# Patient Record
Sex: Female | Born: 1935 | Race: White | Hispanic: No | Smoking: Former smoker
Health system: Southern US, Community
[De-identification: ages and names within clinical notes are randomized; demographics above are authoritative.]

## PROBLEM LIST (undated history)

## (undated) DIAGNOSIS — IMO0002 Reserved for concepts with insufficient information to code with codable children: Secondary | ICD-10-CM

## (undated) DIAGNOSIS — M1990 Unspecified osteoarthritis, unspecified site: Secondary | ICD-10-CM

## (undated) DIAGNOSIS — M810 Age-related osteoporosis without current pathological fracture: Secondary | ICD-10-CM

## (undated) DIAGNOSIS — K649 Unspecified hemorrhoids: Secondary | ICD-10-CM

## (undated) DIAGNOSIS — I10 Essential (primary) hypertension: Secondary | ICD-10-CM

## (undated) DIAGNOSIS — K635 Polyp of colon: Secondary | ICD-10-CM

## (undated) DIAGNOSIS — G510 Bell's palsy: Secondary | ICD-10-CM

## (undated) DIAGNOSIS — D649 Anemia, unspecified: Secondary | ICD-10-CM

## (undated) DIAGNOSIS — H409 Unspecified glaucoma: Secondary | ICD-10-CM

## (undated) DIAGNOSIS — F1011 Alcohol abuse, in remission: Secondary | ICD-10-CM

## (undated) DIAGNOSIS — K861 Other chronic pancreatitis: Secondary | ICD-10-CM

## (undated) DIAGNOSIS — E119 Type 2 diabetes mellitus without complications: Secondary | ICD-10-CM

## (undated) DIAGNOSIS — N189 Chronic kidney disease, unspecified: Secondary | ICD-10-CM

## (undated) DIAGNOSIS — E079 Disorder of thyroid, unspecified: Secondary | ICD-10-CM

## (undated) DIAGNOSIS — E785 Hyperlipidemia, unspecified: Secondary | ICD-10-CM

## (undated) HISTORY — DX: Bell's palsy: G51.0

## (undated) HISTORY — DX: Disorder of thyroid, unspecified: E07.9

## (undated) HISTORY — DX: Anemia, unspecified: D64.9

## (undated) HISTORY — DX: Unspecified hemorrhoids: K64.9

## (undated) HISTORY — DX: Age-related osteoporosis without current pathological fracture: M81.0

## (undated) HISTORY — DX: Reserved for concepts with insufficient information to code with codable children: IMO0002

## (undated) HISTORY — DX: Alcohol abuse, in remission: F10.11

## (undated) HISTORY — DX: Hyperlipidemia, unspecified: E78.5

## (undated) HISTORY — DX: Polyp of colon: K63.5

## (undated) HISTORY — DX: Chronic kidney disease, unspecified: N18.9

## (undated) HISTORY — DX: Type 2 diabetes mellitus without complications: E11.9

## (undated) HISTORY — DX: Other chronic pancreatitis: K86.1

## (undated) HISTORY — DX: Unspecified osteoarthritis, unspecified site: M19.90

## (undated) HISTORY — DX: Unspecified glaucoma: H40.9

## (undated) HISTORY — DX: Essential (primary) hypertension: I10

## (undated) HISTORY — PX: EYE SURGERY: SHX253

---

## 2006-12-24 LAB — HM PAP SMEAR

## 2007-11-29 ENCOUNTER — Emergency Department (HOSPITAL_BASED_OUTPATIENT_CLINIC_OR_DEPARTMENT_OTHER): Admission: EM | Admit: 2007-11-29 | Discharge: 2007-11-29 | Payer: Self-pay | Admitting: Emergency Medicine

## 2007-12-24 ENCOUNTER — Ambulatory Visit: Payer: Self-pay | Admitting: Diagnostic Radiology

## 2007-12-24 ENCOUNTER — Ambulatory Visit (HOSPITAL_BASED_OUTPATIENT_CLINIC_OR_DEPARTMENT_OTHER): Admission: RE | Admit: 2007-12-24 | Discharge: 2007-12-24 | Payer: Self-pay | Admitting: Family Medicine

## 2009-06-06 ENCOUNTER — Emergency Department (HOSPITAL_BASED_OUTPATIENT_CLINIC_OR_DEPARTMENT_OTHER): Admission: EM | Admit: 2009-06-06 | Discharge: 2009-06-07 | Payer: Self-pay | Admitting: Emergency Medicine

## 2009-06-06 ENCOUNTER — Ambulatory Visit: Payer: Self-pay | Admitting: Radiology

## 2010-04-11 LAB — BASIC METABOLIC PANEL
Calcium: 12.3 mg/dL — ABNORMAL HIGH (ref 8.4–10.5)
Creatinine, Ser: 9.8 mg/dL — ABNORMAL HIGH (ref 0.4–1.2)
GFR calc non Af Amer: 4 mL/min — ABNORMAL LOW (ref >60)
Glucose, Bld: 86 mg/dL (ref 70–99)

## 2010-04-11 LAB — URINALYSIS, ROUTINE W REFLEX MICROSCOPIC
Bilirubin Urine: NEGATIVE
Glucose, UA: NEGATIVE mg/dL
Specific Gravity, Urine: 1.011 (ref 1.005–1.030)
pH: 5 (ref 5.0–8.0)

## 2010-04-11 LAB — GLUCOSE, CAPILLARY: Glucose-Capillary: 58 mg/dL — ABNORMAL LOW (ref 70–99)

## 2010-04-11 LAB — DIFFERENTIAL
Basophils Relative: 0 % (ref 0–1)
Eosinophils Absolute: 0.3 10*3/uL (ref 0.0–0.7)
Monocytes Absolute: 0.8 10*3/uL (ref 0.1–1.0)
Monocytes Relative: 8 % (ref 3–12)
Neutro Abs: 8.3 10*3/uL — ABNORMAL HIGH (ref 1.7–7.7)
Neutrophils Relative %: 82 % — ABNORMAL HIGH (ref 43–77)

## 2010-04-11 LAB — URINE MICROSCOPIC-ADD ON

## 2010-04-11 LAB — CBC
Platelets: 163 10*3/uL (ref 150–400)
WBC: 10.2 10*3/uL (ref 4.0–10.5)

## 2010-10-25 LAB — URINALYSIS, ROUTINE W REFLEX MICROSCOPIC
Bilirubin Urine: NEGATIVE
Glucose, UA: NEGATIVE
Ketones, ur: NEGATIVE
Nitrite: NEGATIVE
Protein, ur: 30 — AB
Specific Gravity, Urine: 1.017
Urobilinogen, UA: 0.2
pH: 5

## 2010-10-25 LAB — COMPREHENSIVE METABOLIC PANEL
ALT: 36 — ABNORMAL HIGH
AST: 50 — ABNORMAL HIGH
Albumin: 3.6
Alkaline Phosphatase: 62
BUN: 42 — ABNORMAL HIGH
CO2: 27
Calcium: 9.3
Chloride: 97
Creatinine, Ser: 2 — ABNORMAL HIGH
GFR calc Af Amer: 30 — ABNORMAL LOW
GFR calc non Af Amer: 25 — ABNORMAL LOW
Glucose, Bld: 163 — ABNORMAL HIGH
Potassium: 3.5
Sodium: 136
Total Bilirubin: 0.5
Total Protein: 7

## 2010-10-25 LAB — DIFFERENTIAL
Eosinophils Absolute: 0.1
Eosinophils Relative: 1
Lymphocytes Relative: 5 — ABNORMAL LOW
Lymphs Abs: 0.3 — ABNORMAL LOW
Neutrophils Relative %: 84 — ABNORMAL HIGH

## 2010-10-25 LAB — URINE CULTURE
Colony Count: NO GROWTH
Culture: NO GROWTH

## 2010-10-25 LAB — URINE MICROSCOPIC-ADD ON

## 2010-10-25 LAB — CBC
HCT: 31.3 — ABNORMAL LOW
Hemoglobin: 10.7 — ABNORMAL LOW
Platelets: 134 — ABNORMAL LOW
RDW: 13.2

## 2010-10-25 LAB — GLUCOSE, CAPILLARY: Glucose-Capillary: 154 — ABNORMAL HIGH

## 2011-01-24 LAB — HM COLONOSCOPY

## 2011-11-14 ENCOUNTER — Ambulatory Visit (HOSPITAL_BASED_OUTPATIENT_CLINIC_OR_DEPARTMENT_OTHER)
Admission: RE | Admit: 2011-11-14 | Discharge: 2011-11-14 | Disposition: A | Discharge status: Home / Self Care | Payer: Medicare Other | Source: Ambulatory Visit | Attending: Family Medicine | Admitting: Family Medicine

## 2011-11-14 ENCOUNTER — Other Ambulatory Visit (HOSPITAL_BASED_OUTPATIENT_CLINIC_OR_DEPARTMENT_OTHER): Payer: Self-pay | Admitting: Family Medicine

## 2011-11-14 DIAGNOSIS — R069 Unspecified abnormalities of breathing: Secondary | ICD-10-CM

## 2011-11-14 DIAGNOSIS — R0602 Shortness of breath: Secondary | ICD-10-CM | POA: Insufficient documentation

## 2011-11-14 DIAGNOSIS — R079 Chest pain, unspecified: Secondary | ICD-10-CM | POA: Insufficient documentation

## 2012-09-09 ENCOUNTER — Ambulatory Visit (INDEPENDENT_AMBULATORY_CARE_PROVIDER_SITE_OTHER): Payer: Medicare Other | Admitting: Family Medicine

## 2012-09-09 ENCOUNTER — Ambulatory Visit (HOSPITAL_BASED_OUTPATIENT_CLINIC_OR_DEPARTMENT_OTHER)
Admission: RE | Admit: 2012-09-09 | Discharge: 2012-09-09 | Disposition: A | Discharge status: Home / Self Care | Payer: Medicare Other | Source: Ambulatory Visit | Attending: Family Medicine | Admitting: Family Medicine

## 2012-09-09 ENCOUNTER — Other Ambulatory Visit: Payer: Self-pay | Admitting: *Deleted

## 2012-09-09 ENCOUNTER — Encounter: Payer: Self-pay | Admitting: Family Medicine

## 2012-09-09 VITALS — BP 120/63 | HR 64 | Resp 16 | Ht 68.0 in | Wt 114.0 lb

## 2012-09-09 DIAGNOSIS — E785 Hyperlipidemia, unspecified: Secondary | ICD-10-CM | POA: Insufficient documentation

## 2012-09-09 DIAGNOSIS — D631 Anemia in chronic kidney disease: Secondary | ICD-10-CM | POA: Insufficient documentation

## 2012-09-09 DIAGNOSIS — M159 Polyosteoarthritis, unspecified: Secondary | ICD-10-CM | POA: Insufficient documentation

## 2012-09-09 DIAGNOSIS — N390 Urinary tract infection, site not specified: Secondary | ICD-10-CM | POA: Insufficient documentation

## 2012-09-09 DIAGNOSIS — L2089 Other atopic dermatitis: Secondary | ICD-10-CM | POA: Insufficient documentation

## 2012-09-09 DIAGNOSIS — R0600 Dyspnea, unspecified: Secondary | ICD-10-CM | POA: Insufficient documentation

## 2012-09-09 DIAGNOSIS — R634 Abnormal weight loss: Secondary | ICD-10-CM

## 2012-09-09 DIAGNOSIS — M546 Pain in thoracic spine: Secondary | ICD-10-CM | POA: Insufficient documentation

## 2012-09-09 DIAGNOSIS — R490 Dysphonia: Secondary | ICD-10-CM

## 2012-09-09 DIAGNOSIS — R0602 Shortness of breath: Secondary | ICD-10-CM | POA: Insufficient documentation

## 2012-09-09 DIAGNOSIS — R42 Dizziness and giddiness: Secondary | ICD-10-CM | POA: Insufficient documentation

## 2012-09-09 DIAGNOSIS — M549 Dorsalgia, unspecified: Secondary | ICD-10-CM | POA: Insufficient documentation

## 2012-09-09 DIAGNOSIS — I7090 Unspecified atherosclerosis: Secondary | ICD-10-CM | POA: Insufficient documentation

## 2012-09-09 DIAGNOSIS — E039 Hypothyroidism, unspecified: Secondary | ICD-10-CM

## 2012-09-09 DIAGNOSIS — M810 Age-related osteoporosis without current pathological fracture: Secondary | ICD-10-CM | POA: Insufficient documentation

## 2012-09-09 DIAGNOSIS — F329 Major depressive disorder, single episode, unspecified: Secondary | ICD-10-CM | POA: Insufficient documentation

## 2012-09-09 MED ORDER — LIDOCAINE 5 % EX PTCH: MEDICATED_PATCH | CUTANEOUS | Status: DC

## 2012-09-09 MED ORDER — ACETAMINOPHEN-CODEINE #3 300-30 MG PO TABS: 1.0000 | ORAL_TABLET | Freq: Four times a day (QID) | ORAL | Status: DC | PRN

## 2012-09-09 NOTE — Progress Notes (Signed)
Subjective:    Patient ID: Stacy Ware, female    DOB: Apr 10, 1934, 78 y.o.   MRN: [id number]  Marianela is here today complaining of pain located in her left mid back.    Back Pain This is a recurrent problem. The current episode started more than 1 month ago. The problem occurs constantly. The problem has been waxing and waning since onset. Pain location: left mid back. The quality of the pain is described as stabbing. The pain does not radiate. The pain is moderate. The pain is the same all the time. The symptoms are aggravated by position. (Shortness of breath) Risk factors include menopause and lack of exercise. She has tried analgesics for the symptoms. The treatment provided mild relief.    Review of Systems  Constitutional: Positive for unexpected weight change (Weight Loss ).  HENT: Positive for voice change.   Eyes: Negative.   Respiratory: Negative.   Cardiovascular: Negative.   Gastrointestinal: Negative.   Endocrine: Negative.   Genitourinary: Negative.   Musculoskeletal: Positive for back pain.       Mid back   Skin: Negative.   Allergic/Immunologic: Negative.   Neurological: Negative.   Hematological: Negative.   Psychiatric/Behavioral: Negative.     Past Medical History  Diagnosis Date  . History of alcohol abuse   . 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 She is followed by Dr. Izell McSwain; She sees Dr. Hazle Quant 2-3 x per year.     . Hyperlipidemia   . Hypertension   . Thyroid disease   . Glaucoma   . DDD (degenerative disc disease)   . Osteoporosis   . CKD (chronic kidney disease)      (Stage III);  Creatinine 1.3/Creatinine Clearance 43 on 10/05/2004.  She had a renal U/S in 10/2003 which showed a normal right kidney measuring 10.4 cm and an echogenic left kidney measuring 8.7 cm. She is followed by Dr. Louis Meckel.    . Anemia     Followed by Dr Allyne Gee  . Chronic pancreatitis    . Osteoarthritis   . Bell's palsy   . Hemorrhoids   . Colon polyp      Family History  Problem Relation Age of Onset  . AAA (abdominal aortic aneurysm) Father   . Heart disease Brother   . Heart disease Paternal Aunt   . Cancer Maternal Grandmother      History   Social History Narrative   Marital Status: Widowed Iantha Fallen)    Children:  Daughter Marylene Land)    Pets:  None   Living Situation: Lives alone    Occupation: Homemaker   Education:  Associate's Degree   Tobacco Use/Exposure: She smoked 1 ppd for 25 years.  She quit smoking in 1984.     Alcohol Use:  None; She has a history of alcoholism and quit drinking in the mid 90s.     Drug Use:  None   Diet:  Regular   Exercise:  She is active.   Hobbies:  Gardening, Reading      Objective:   Physical Exam  Vitals reviewed. Constitutional: She is oriented to person, place, and time. She appears well-developed and well-nourished.  HENT:  Head: Normocephalic.  Eyes: No scleral icterus.  Neck: Neck supple. No thyromegaly present.  Cardiovascular: Normal rate and regular rhythm.   Pulmonary/Chest: Effort normal and breath sounds normal. She has no wheezes.  Musculoskeletal: She exhibits tenderness (Pain in  thoracic spine  ).  Lymphadenopathy:    She has no cervical adenopathy.  Neurological: She is alert and oriented to person, place, and time.  Skin: Skin is warm and dry. No rash noted.  Psychiatric: She has a normal mood and affect. Her behavior is normal. Judgment and thought content normal.      Assessment & Plan:

## 2012-09-09 NOTE — Patient Instructions (Addendum)
1)  Back Pain - Your x-ray was fine which did not show a compression fracture in your spine.  Take the Tylenol 3, apply a Lidoderm Patch and consider a rib belt and/or a chiropractic adjustment.    2)  Hypothyroidism:  The TSH that was checked at Dr. Eliane Decree office is a little low which would suggest that the thyroid medication dosages are a little high.  In 2011 & 2012, I wrote for you to take the Cytomel twice a day.  I changed this last October and the prescription should have said to take it once a day.  Decrease the dosage of the Cytomel to one per day with one Levothyroxine first thing in the am.  Dr. Allena Katz can recheck your labs in 6-8 weeks after you have made a change in your dosage.  I think that it is a waste of time and money to have it checked before you have made a change.  If you would like to decrease the number of pills you are taking, you could stop the Cytomel (T3) and see how you do on just levothyroxine (T4).  You might need a little higher dosage of the levothyroxine i.e. 88 mcg to make up for taking the Cytomel away.    3)  Voice Change/Weight Loss:  Call Dr. Laurice Record office and schedule a follow up.       Hoarseness Hoarseness is produced from a variety of causes. It is important to find the cause so it can be treated. In the absence of a cold or upper respiratory illness, any hoarseness lasting more than 2 weeks should be looked at by a specialist. This is especially important if you have a history of smoking or alcohol use. It is also important to keep in mind that as you grow older, your voice will naturally get weaker, making it easier for you to become hoarse from straining your vocal cords.  CAUSES  Any illness that affects your vocal cords can result in a hoarse voice. Examples of conditions that can affect the vocal cords are listed as follows:   Allergies.  Colds.  Sinusitis.  Gastroesophageal reflux disease.  Croup.  Injury.  Nodules.  Exposure to smoke  or toxic fumes or gases.  Congenital and genetic defects.  Paralysis of the vocal cords.  Infections.  Advanced age. DIAGNOSIS  In order to diagnose the cause of your hoarseness, your caregiver will examine your throat using an instrument that uses a tube with a small lighted camera (laryngoscope). It allows your caregiver to look into the mouth and down the throat. TREATMENT  For most cases, treatment will focus on the specific cause of the hoarseness. Depending on the cause, hoarseness can be a temporary condition (acute) or it can be long lasting (chronic). Most cases of hoarseness clear up without complications. Your caregiver will explain to you if this is not likely to happen. SEEK IMMEDIATE MEDICAL CARE IF:   You have increasing hoarseness or loss of voice.  You have shortness of breath.  You are coughing up blood.  There is pain in your neck or throat. Document Released: 12/23/2004 Document Revised: 04/03/2011 Document Reviewed: 03/17/2010 [facility] Patient Information 2014 [place], [place].

## 2012-10-15 ENCOUNTER — Encounter: Payer: Self-pay | Admitting: *Deleted

## 2012-10-15 LAB — HM DIABETES EYE EXAM

## 2012-10-25 ENCOUNTER — Encounter: Payer: Self-pay | Admitting: Family Medicine

## 2012-11-03 ENCOUNTER — Encounter: Payer: Self-pay | Admitting: Family Medicine

## 2012-11-03 DIAGNOSIS — E1129 Type 2 diabetes mellitus with other diabetic kidney complication: Secondary | ICD-10-CM | POA: Insufficient documentation

## 2012-11-03 DIAGNOSIS — IMO0002 Reserved for concepts with insufficient information to code with codable children: Secondary | ICD-10-CM | POA: Insufficient documentation

## 2012-11-03 DIAGNOSIS — R490 Dysphonia: Secondary | ICD-10-CM | POA: Insufficient documentation

## 2012-11-03 DIAGNOSIS — R634 Abnormal weight loss: Secondary | ICD-10-CM | POA: Insufficient documentation

## 2012-11-03 NOTE — Assessment & Plan Note (Signed)
She is to call Dr. Laurice Record office and schedule an appointment.

## 2012-11-03 NOTE — Assessment & Plan Note (Signed)
Stacy Bible has lost 10 lbs over the past year.  This may be due to her being hyperthyroid which will improve once she decreases the Cytomel.  She says that she does not have an appetite.  She might benefit from some Paxil or Remeron to help increase her appetite.

## 2012-11-03 NOTE — Assessment & Plan Note (Addendum)
Her TSH was low at the last check.  In looking back over her chart in [facility], she was taking Cytomel twice a day for a couple of years but I had decreased her last fall. It appears that she has continued to take it BID so she is to decrease it to once a day.  She'll have Dr. Allena Katz recheck her thyroid level in a couple of months.

## 2012-11-03 NOTE — Assessment & Plan Note (Addendum)
She was sent for an x-ray which was normal.  She was given prescriptions for Lidoderm patches and Tylenol 3.

## 2012-11-11 ENCOUNTER — Ambulatory Visit: Payer: Medicare Other

## 2012-11-11 ENCOUNTER — Telehealth: Payer: Self-pay | Admitting: *Deleted

## 2012-11-11 NOTE — Telephone Encounter (Signed)
ERROR

## 2012-11-13 LAB — HM MAMMOGRAPHY: HM Mammogram: NORMAL

## 2012-11-20 ENCOUNTER — Encounter: Payer: Self-pay | Admitting: *Deleted

## 2012-12-31 ENCOUNTER — Other Ambulatory Visit: Payer: Self-pay | Admitting: *Deleted

## 2012-12-31 NOTE — Telephone Encounter (Signed)
Refill request pt states that she has had recent labs drawn at Dr Eliane Decree office

## 2013-02-19 ENCOUNTER — Other Ambulatory Visit: Payer: Self-pay | Admitting: *Deleted

## 2013-02-19 DIAGNOSIS — R5381 Other malaise: Secondary | ICD-10-CM

## 2013-02-19 DIAGNOSIS — E039 Hypothyroidism, unspecified: Secondary | ICD-10-CM

## 2013-02-19 DIAGNOSIS — R5383 Other fatigue: Secondary | ICD-10-CM

## 2013-02-19 DIAGNOSIS — E785 Hyperlipidemia, unspecified: Secondary | ICD-10-CM

## 2013-02-20 ENCOUNTER — Other Ambulatory Visit: Payer: Medicare Other

## 2013-02-24 ENCOUNTER — Other Ambulatory Visit (INDEPENDENT_AMBULATORY_CARE_PROVIDER_SITE_OTHER): Payer: Medicare Other

## 2013-02-24 LAB — COMPLETE METABOLIC PANEL WITH GFR
ALT: 16 U/L (ref 0–35)
AST: 17 U/L (ref 0–37)
Albumin: 3.7 g/dL (ref 3.5–5.2)
Alkaline Phosphatase: 71 U/L (ref 39–117)
BUN: 34 mg/dL — ABNORMAL HIGH (ref 6–23)
CO2: 16 mEq/L — ABNORMAL LOW (ref 19–32)
Calcium: 7.7 mg/dL — ABNORMAL LOW (ref 8.4–10.5)
Chloride: 112 mEq/L (ref 96–112)
Creat: 2.32 mg/dL — ABNORMAL HIGH (ref 0.50–1.10)
GFR, Est African American: 23 mL/min — ABNORMAL LOW
GFR, Est Non African American: 20 mL/min — ABNORMAL LOW
Glucose, Bld: 93 mg/dL (ref 70–99)
Potassium: 3.9 mEq/L (ref 3.5–5.3)
Sodium: 139 mEq/L (ref 135–145)
Total Bilirubin: 0.4 mg/dL (ref 0.2–1.2)
Total Protein: 5.9 g/dL — ABNORMAL LOW (ref 6.0–8.3)

## 2013-02-24 LAB — CBC WITH DIFFERENTIAL/PLATELET
Basophils Absolute: 0 10*3/uL (ref 0.0–0.1)
Basophils Relative: 1 % (ref 0–1)
Eosinophils Absolute: 0.1 10*3/uL (ref 0.0–0.7)
Eosinophils Relative: 2 % (ref 0–5)
HCT: 30.8 % — ABNORMAL LOW (ref 36.0–46.0)
Hemoglobin: 10.2 g/dL — ABNORMAL LOW (ref 12.0–15.0)
Lymphocytes Relative: 15 % (ref 12–46)
Lymphs Abs: 0.9 10*3/uL (ref 0.7–4.0)
MCH: 30.6 pg (ref 26.0–34.0)
MCHC: 33.1 g/dL (ref 30.0–36.0)
MCV: 92.5 fL (ref 78.0–100.0)
Monocytes Absolute: 0.4 10*3/uL (ref 0.1–1.0)
Monocytes Relative: 6 % (ref 3–12)
Neutro Abs: 4.5 10*3/uL (ref 1.7–7.7)
Neutrophils Relative %: 76 % (ref 43–77)
Platelets: 154 10*3/uL (ref 150–400)
RBC: 3.33 MIL/uL — ABNORMAL LOW (ref 3.87–5.11)
RDW: 14.6 % (ref 11.5–15.5)
WBC: 5.9 10*3/uL (ref 4.0–10.5)

## 2013-02-24 LAB — T4, FREE: Free T4: 0.68 ng/dL — ABNORMAL LOW (ref 0.80–1.80)

## 2013-02-24 LAB — LIPID PANEL
Cholesterol: 149 mg/dL (ref 0–200)
HDL: 41 mg/dL (ref >39)
LDL Cholesterol: 88 mg/dL (ref 0–99)
Total CHOL/HDL Ratio: 3.6 Ratio
Triglycerides: 101 mg/dL (ref <150)
VLDL: 20 mg/dL (ref 0–40)

## 2013-02-24 LAB — T3, FREE: T3, Free: 2.3 pg/mL (ref 2.3–4.2)

## 2013-02-24 LAB — TSH: TSH: 2.668 u[IU]/mL (ref 0.350–4.500)

## 2013-03-05 ENCOUNTER — Ambulatory Visit (INDEPENDENT_AMBULATORY_CARE_PROVIDER_SITE_OTHER): Payer: Medicare Other | Admitting: Family Medicine

## 2013-03-05 ENCOUNTER — Encounter: Payer: Self-pay | Admitting: Family Medicine

## 2013-03-05 VITALS — BP 126/73 | HR 57 | Resp 16 | Ht 68.0 in | Wt 114.0 lb

## 2013-03-05 DIAGNOSIS — I10 Essential (primary) hypertension: Secondary | ICD-10-CM

## 2013-03-05 DIAGNOSIS — J3489 Other specified disorders of nose and nasal sinuses: Secondary | ICD-10-CM

## 2013-03-05 DIAGNOSIS — E559 Vitamin D deficiency, unspecified: Secondary | ICD-10-CM

## 2013-03-05 DIAGNOSIS — K8689 Other specified diseases of pancreas: Secondary | ICD-10-CM

## 2013-03-05 DIAGNOSIS — Z23 Encounter for immunization: Secondary | ICD-10-CM | POA: Insufficient documentation

## 2013-03-05 DIAGNOSIS — M1990 Unspecified osteoarthritis, unspecified site: Secondary | ICD-10-CM

## 2013-03-05 DIAGNOSIS — R0989 Other specified symptoms and signs involving the circulatory and respiratory systems: Secondary | ICD-10-CM

## 2013-03-05 MED ORDER — IPRATROPIUM BROMIDE 0.06 % NA SOLN: 2.0000 | Freq: Two times a day (BID) | NASAL | Status: DC

## 2013-03-05 MED ORDER — PANCRELIPASE (LIP-PROT-AMYL) 21000 UNITS PO CPEP: 1.0000 | ORAL_CAPSULE | Freq: Three times a day (TID) | ORAL | Status: AC

## 2013-03-05 MED ORDER — NEBIVOLOL HCL 10 MG PO TABS: 10.0000 mg | ORAL_TABLET | Freq: Every day | ORAL | Status: DC

## 2013-03-05 MED ORDER — VITAMIN D (ERGOCALCIFEROL) 1.25 MG (50000 UNIT) PO CAPS: 50000.0000 [IU] | ORAL_CAPSULE | ORAL | Status: AC

## 2013-03-05 MED ORDER — PNEUMOCOCCAL 13-VAL CONJ VACC IM SUSP: 0.5000 mL | INTRAMUSCULAR | Status: DC

## 2013-03-05 MED ORDER — TRAMADOL-ACETAMINOPHEN 37.5-325 MG PO TABS: ORAL_TABLET | ORAL | Status: DC

## 2013-03-05 NOTE — Progress Notes (Signed)
Subjective:    Patient ID: Stacy Ware, female    DOB: 01-07-1935, 79 y.o.   MRN: [id number]  HPI  Stacy Ware is here today go over her most recent lab results and to discuss the conditions listed below:  1)  Hypertension - Her BP is well controlled on 5 mg of Bystolic (1/2 of 10 mg) and needs to have it refilled.    2)   Allergic Rhinitis - She continues to use her Atrovent nasal spray and needs a refill on it.    3)  Pancreatic Insuffiency - She needs a refill on her Pancreaze.    4)  Diabetes - She says that her sugars have been under pretty good control.  They are managed by Dr. Allena Katz with [facility].  She is UTD with her eye exam (Dr. Hazle Quant).      Review of Systems  Constitutional: Negative for activity change, fatigue and unexpected weight change.  HENT: Negative.   Eyes: Negative.   Respiratory: Negative for shortness of breath.   Cardiovascular: Negative for chest pain, palpitations and leg swelling.  Gastrointestinal: Negative for diarrhea and constipation.  Endocrine: Negative.   Genitourinary: Negative for difficulty urinating.  Musculoskeletal: Negative.   Skin: Negative.   Neurological: Negative.   Hematological: Negative for adenopathy. Does not bruise/bleed easily.  Psychiatric/Behavioral: Negative for sleep disturbance and dysphoric mood. The patient is not nervous/anxious.     Past Medical History  Diagnosis Date  . History of alcohol abuse   . 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 She is followed by Dr. Izell Vermillion; She sees Dr. Hazle Quant 2-3 x per year.     . Hyperlipidemia   . Hypertension   . Thyroid disease   . Glaucoma   . DDD (degenerative disc disease)   . Osteoporosis   . CKD (chronic kidney disease)      (Stage III);  Creatinine 1.3/Creatinine Clearance 43 on 10/05/2004.  She had a renal U/S in 10/2003 which showed a normal right kidney measuring 10.4 cm and an  echogenic left kidney measuring 8.7 cm. She is followed by Dr. Louis Meckel.    . Anemia     Followed by Dr Allyne Gee  . Chronic pancreatitis   . Osteoarthritis   . Bell's palsy   . Hemorrhoids   . Colon polyp      Past Surgical History  Procedure Laterality Date  . Eye surgery      Cataract     History   Social History Narrative   Marital Status: Widowed Iantha Fallen)    Children:  Daughter Marylene Land)    Pets:  None   Living Situation: Lives alone    Occupation: Homemaker   Education:  Associate's Degree   Tobacco Use/Exposure: She smoked 1 ppd for 25 years.  She quit smoking in 1984.     Alcohol Use:  None; She has a history of alcoholism and quit drinking in the mid 90s.     Drug Use:  None   Diet:  Regular   Exercise:  She is active.   Hobbies:  Gardening, Reading      Family History  Problem Relation Age of Onset  . AAA (abdominal aortic aneurysm) Father   . Heart disease Brother   . Heart disease Paternal Aunt   . Cancer Maternal Grandmother      Current Outpatient Prescriptions on File Prior to Visit  Medication Sig Dispense  Refill  . insulin aspart (NOVOLOG) 100 UNIT/ML injection Inject 100 Units into the skin 3 (three) times daily with meals. Insulin Pump.  Use as directed      . latanoprost (XALATAN) 0.005 % ophthalmic solution Place 1 drop into both eyes at bedtime.      Marland Kitchen. levothyroxine (SYNTHROID, LEVOTHROID) 75 MCG tablet Take 75 mcg by mouth daily before breakfast.      . liothyronine (CYTOMEL) 5 MCG tablet Take 5 mcg by mouth daily.      . sodium bicarbonate 650 MG tablet Take 650 mg by mouth 2 (two) times daily.       No current facility-administered medications on file prior to visit.     No Known Allergies   Immunization History  Administered Date(s) Administered  . Influenza-Unspecified 10/23/2012  . Pneumococcal Conjugate-13 03/05/2013  . Pneumococcal-Unspecified 11/09/2005  . Td 08/09/2005  . Tdap 11/29/2010  . Zoster 12/26/2005          Objective:   Physical Exam  Vitals reviewed. Constitutional: She is oriented to person, place, and time.  Eyes: Conjunctivae are normal. No scleral icterus.  Neck: Neck supple. No thyromegaly present.  Cardiovascular: Normal rate, regular rhythm and normal heart sounds.   Pulmonary/Chest: Effort normal and breath sounds normal.  Musculoskeletal: She exhibits no edema and no tenderness.  Lymphadenopathy:    She has no cervical adenopathy.  Neurological: She is alert and oriented to person, place, and time.  Skin: Skin is warm and dry.  Psychiatric: She has a normal mood and affect. Her behavior is normal. Judgment and thought content normal.      Assessment & Plan:    Stacy Ware was seen today for medication management.  Diagnoses and associated orders for this visit:  Essential hypertension, benign - nebivolol (BYSTOLIC) 10 MG tablet; Take 1 tablet (10 mg total) by mouth daily. Take 0.5 to 1 pill by mouth QD.  Runny nose - ipratropium (ATROVENT) 0.06 % nasal spray; Place 2 sprays into the nose 2 (two) times daily.  Pancreatic insufficiency - Pancrelipase, Lip-Prot-Amyl, (PANCREAZE) [id number] UNITS CPEP; Take 1 capsule (21,000 Units total) by mouth 3 (three) times daily before meals.  Unspecified vitamin D deficiency - Vitamin D, Ergocalciferol, (DRISDOL) 50000 UNITS CAPS capsule; Take 1 capsule (50,000 Units total) by mouth every 7 (seven) days.  Osteoarthritis - traMADol-acetaminophen (ULTRACET) 37.5-325 MG per tablet; Start with 1 tab daily for pain and increase to 1 tab TID if needed for pain  Need for prophylactic vaccination against Streptococcus pneumoniae (pneumococcus) Comments: She received her Prevnar-13 without difficulty.  She was given a handout discussing possible side effects. - Pneumococcal conjugate vaccine 13-valent  TIME SPENT "FACE TO FACE" WITH PATIENT -  45 MINS

## 2013-03-05 NOTE — Assessment & Plan Note (Signed)
She received her Prevnar-13 without difficulty.  She was given a handout discussing possible side effects.  

## 2013-09-16 ENCOUNTER — Other Ambulatory Visit: Payer: Self-pay | Admitting: Family Medicine

## 2013-09-16 DIAGNOSIS — M159 Polyosteoarthritis, unspecified: Secondary | ICD-10-CM

## 2013-09-16 DIAGNOSIS — M150 Primary generalized (osteo)arthritis: Principal | ICD-10-CM

## 2013-09-16 MED ORDER — TRAMADOL-ACETAMINOPHEN 37.5-325 MG PO TABS: ORAL_TABLET | ORAL | Status: AC

## 2014-09-21 LAB — POCT INR: INR: 1 (ref <=1.1)

## 2014-09-21 LAB — PROTIME-INR: PROTIME: 12.7 s (ref 10.0–13.8)

## 2014-11-02 LAB — BASIC METABOLIC PANEL
BUN: 62 mg/dL — AB (ref 4–21)
CREATININE: 3.4 mg/dL — AB (ref <=1.1)
Potassium: 4.2 mmol/L (ref 3.4–5.3)
Sodium: 131 mmol/L — AB (ref 137–147)

## 2014-11-02 LAB — HEPATIC FUNCTION PANEL
ALK PHOS: 59 U/L (ref 25–125)
ALT: 14 U/L (ref 7–35)
AST: 16 U/L (ref 13–35)
Bilirubin, Total: 0.4 mg/dL

## 2014-11-02 LAB — CBC AND DIFFERENTIAL
HEMATOCRIT: 27 % — AB (ref 36–46)
Hemoglobin: 9.1 g/dL — AB (ref 12.0–16.0)
PLATELETS: 129 10*3/uL — AB (ref 150–399)
WBC: 6.3 10*3/mL

## 2014-11-03 ENCOUNTER — Non-Acute Institutional Stay (SKILLED_NURSING_FACILITY): Payer: Medicare Other | Admitting: Adult Health

## 2014-11-03 ENCOUNTER — Encounter: Payer: Self-pay | Admitting: *Deleted

## 2014-11-03 ENCOUNTER — Encounter: Payer: Self-pay | Admitting: Adult Health

## 2014-11-03 DIAGNOSIS — D631 Anemia in chronic kidney disease: Secondary | ICD-10-CM | POA: Insufficient documentation

## 2014-11-03 DIAGNOSIS — F329 Major depressive disorder, single episode, unspecified: Secondary | ICD-10-CM

## 2014-11-03 DIAGNOSIS — F32A Depression, unspecified: Secondary | ICD-10-CM

## 2014-11-03 DIAGNOSIS — Z992 Dependence on renal dialysis: Secondary | ICD-10-CM

## 2014-11-03 DIAGNOSIS — S32309A Unspecified fracture of unspecified ilium, initial encounter for closed fracture: Secondary | ICD-10-CM | POA: Insufficient documentation

## 2014-11-03 DIAGNOSIS — D696 Thrombocytopenia, unspecified: Secondary | ICD-10-CM | POA: Insufficient documentation

## 2014-11-03 DIAGNOSIS — I10 Essential (primary) hypertension: Secondary | ICD-10-CM

## 2014-11-03 DIAGNOSIS — E43 Unspecified severe protein-calorie malnutrition: Secondary | ICD-10-CM | POA: Diagnosis not present

## 2014-11-03 DIAGNOSIS — IMO0001 Reserved for inherently not codable concepts without codable children: Secondary | ICD-10-CM | POA: Insufficient documentation

## 2014-11-03 DIAGNOSIS — S6992XS Unspecified injury of left wrist, hand and finger(s), sequela: Secondary | ICD-10-CM | POA: Diagnosis not present

## 2014-11-03 DIAGNOSIS — S32302S Unspecified fracture of left ilium, sequela: Secondary | ICD-10-CM | POA: Diagnosis not present

## 2014-11-03 DIAGNOSIS — E119 Type 2 diabetes mellitus without complications: Secondary | ICD-10-CM

## 2014-11-03 DIAGNOSIS — N186 End stage renal disease: Secondary | ICD-10-CM

## 2014-11-03 DIAGNOSIS — N189 Chronic kidney disease, unspecified: Secondary | ICD-10-CM | POA: Diagnosis not present

## 2014-11-03 DIAGNOSIS — Z794 Long term (current) use of insulin: Secondary | ICD-10-CM

## 2014-11-03 DIAGNOSIS — E039 Hypothyroidism, unspecified: Secondary | ICD-10-CM | POA: Diagnosis not present

## 2014-11-03 NOTE — Progress Notes (Signed)
Patient ID: Stacy Ware, female   DOB: 06/25/1934, 80 y.o.   MRN: [id number]    DATE:  11/03/2014 MRN:  [id number]  BIRTHDAY: 06-18-1934  Facility:  Nursing Home Location:  [facility]  Nursing Home Room Number: 203-2  LEVEL OF CARE:  SNF [phone])  Contact Information    Name Relation Home Work Mobile   Mabe,Angela Daughter   [phone]   Mabe,Brian Other [phone]  [phone]       Chief Complaint  Patient presents with  . Hospitalization Follow-up    Fracture of left iliac wing, left wrist pain, anemia, thrombocytopenia, ESRD, IV DM, hypertension, depression, hypothyroidism and protein calorie malnutrition    HISTORY OF PRESENT ILLNESS:  This is a 80 year old female who has been admitted to [facility] on 11/02/14 from [facility] area she has history of hypertension, IV DM, osteoarthritis, ESRD, hyperlipidemia, anemia and thrombocytopenia. She had a fall at home and sustained fracture of her left iliac wing and left wrist pain.  She has been admitted for a short-term rehabilitation  PAST MEDICAL HISTORY:  Past Medical History  Diagnosis Date  . History of alcohol abuse   . Diabetes mellitus without complication (HCC)     Secondary to severe pancreatic disease/failure as a result of alcohol abuse. She is followed by Dr. Izell Van; She sees Dr. Hazle Quant 2-3 x per year.     . Hyperlipidemia   . Hypertension   . Thyroid disease   . Glaucoma   . DDD (degenerative disc disease)   . Osteoporosis   . CKD (chronic kidney disease)      (Stage III);  Creatinine 1.3/Creatinine Clearance 43 on 10/05/2004.  She had a renal U/S in 10/2003 which showed a normal right kidney measuring 10.4 cm and an echogenic left kidney measuring 8.7 cm. She is followed by Dr. Louis Meckel.    . Anemia     Followed by Dr Allyne Gee  . Chronic pancreatitis (HCC)   . Osteoarthritis   . Bell's palsy   . Hemorrhoids   . Colon polyp      CURRENT MEDICATIONS:  Reviewed  Patient's Medications  New Prescriptions   No medications on file  Previous Medications   DULOXETINE (CYMBALTA) 20 MG CAPSULE    Take 40 mg by mouth daily.   HYDROCODONE-ACETAMINOPHEN (NORCO/VICODIN) 5-325 MG TABLET    Take 1 tablet by mouth every 6 (six) hours as needed for moderate pain.   INSULIN ASPART (NOVOLOG) 100 UNIT/ML INJECTION    Inject 2-12 Units into the skin 3 (three) times daily with meals. SSI SQ TID; 141-180 = 2 units; 181-220=4 units; 221-260=6units; 261-300=8units; 301-340=10 units; >340 = 12 units   INSULIN ASPART (NOVOLOG) 100 UNIT/ML INJECTION    Inject 2-6 Units into the skin at bedtime. SSI SQ Q HS;  151-200=2 units; 201-250=3 units; 251-300=4units; 301-350=5 units; 351 and up 6 units   LATANOPROST (XALATAN) 0.005 % OPHTHALMIC SOLUTION    Place 1 drop into both eyes at bedtime.   LEVOTHYROXINE (SYNTHROID, LEVOTHROID) 75 MCG TABLET    Take 75 mcg by mouth daily before breakfast.   NEBIVOLOL (BYSTOLIC) 10 MG TABLET    Take 1 tablet (10 mg total) by mouth daily. Take 0.5 to 1 pill by mouth QD.   NEBIVOLOL (BYSTOLIC) 10 MG TABLET    Take 10 mg by mouth daily.   PROMETHAZINE (PHENERGAN) 12.5 MG TABLET    Take 12.5 mg by mouth every 6 (six) hours as needed  for nausea or vomiting (Take 1/2/-2 tabs po every 4-6 hours as needed for nausea.).   TRAMADOL-ACETAMINOPHEN (ULTRACET) 37.5-325 MG TABLET    Take 1 tablet by mouth every 8 (eight) hours as needed.  Modified Medications   No medications on file  Discontinued Medications   IPRATROPIUM (ATROVENT) 0.06 % NASAL SPRAY    Place 2 sprays into the nose 2 (two) times daily.     No Known Allergies   REVIEW OF SYSTEMS:  GENERAL: no change in appetite, no fatigue, no weight changes, no fever, chills or weakness EYES: Denies change in vision, dry eyes, eye pain, itching or discharge EARS: Denies change in hearing, ringing in ears, or earache NOSE: Denies nasal congestion or epistaxis MOUTH and THROAT: Denies oral  discomfort, gingival pain or bleeding, pain from teeth or hoarseness   RESPIRATORY: no cough, SOB, DOE, wheezing, hemoptysis CARDIAC: no chest pain, edema or palpitations GI: no abdominal pain, diarrhea, constipation, heart burn, nausea or vomiting GU: Denies dysuria, frequency, hematuria, incontinence, or discharge PSYCHIATRIC: Denies feeling of depression or anxiety. No report of hallucinations, insomnia, paranoia, or agitation   PHYSICAL EXAMINATION  GENERAL APPEARANCE: Well nourished. In no acute distress. Normal body habitus HEAD: Normal in size and contour. No evidence of trauma EYES: Lids open and close normally. No blepharitis, entropion or ectropion. PERRL. Conjunctivae are clear and sclerae are white. Lenses are without opacity EARS: Pinnae are normal. Patient hears normal voice tunes of the examiner MOUTH and THROAT: Lips are without lesions. Oral mucosa is moist and without lesions. Tongue is normal in shape, size, and color and without lesions NECK: supple, trachea midline, no neck masses, no thyroid tenderness, no thyromegaly LYMPHATICS: no LAN in the neck, no supraclavicular LAN RESPIRATORY: breathing is even & unlabored, BS CTAB CARDIAC: RRR, no murmur,no extra heart sounds, no edema; right chest PermCath GI: abdomen soft, normal BS, no masses, no tenderness, no hepatomegaly, no splenomegaly EXTREMITIES:  Able to move 4 extremities; left wrist has splint PSYCHIATRIC: Alert and oriented X 3. Affect and behavior are appropriate  LABS/RADIOLOGY: Labs reviewed: Basic Metabolic Panel:  Recent Labs  40/98/11  NA 131*  K 4.2  BUN 62*  CREATININE 3.4*   Liver Function Tests:  Recent Labs  11/02/14  AST 16  ALT 14  ALKPHOS 59   CBC:  Recent Labs  11/02/14  WBC 6.3  HGB 9.1*  HCT 27*  PLT 129*     ASSESSMENT/PLAN:  Fracture of left iliac wing - for rehabilitation; continue Norco 5/325 mg 1 tab by mouth every 6 hours when necessary and Ultracet 37.5-325 mg  1 tab by mouth every 8 hours when necessary for pain  Anemia of chronic kidney disease - hemoglobin 9.1; check CBC  Thrombocytopenia - platelet 129; will monitor  ESRD - continue hemodialysis on M- W-F  IDDM - continue NovoLog SS I subcutaneous before meals and at bedtime; check hemoglobin A1c  Hypertension - continue Bystolic 10 mg 1 tab by mouth daily; check BP/heart rate twice a day 1 week  Depression - mood is stable; continue Cymbalta 40 mg by mouth daily  Hypothyroidism - continue Synthroid 75 g by mouth daily; check TSH  Protein calorie malnutrition, severe - albumin 2.8; RD consult; check BMP   Left wrist pain - continue supportive management; splinting and pain medication     Goals of care:  Short-term rehabilitation    [facility], NP [facility] [phone]

## 2014-11-04 LAB — CBC AND DIFFERENTIAL
HEMATOCRIT: 29 % — AB (ref 36–46)
HEMOGLOBIN: 9 g/dL — AB (ref 12.0–16.0)
PLATELETS: 134 10*3/uL — AB (ref 150–399)
WBC: 4.8 10^3/mL

## 2014-11-04 LAB — BASIC METABOLIC PANEL
BUN: 46 mg/dL — AB (ref 4–21)
Creatinine: 3 mg/dL — AB (ref 0.5–1.1)
Glucose: 130 mg/dL
Potassium: 3.6 mmol/L (ref 3.4–5.3)
Sodium: 138 mmol/L (ref 137–147)

## 2014-11-04 LAB — HEMOGLOBIN A1C: HEMOGLOBIN A1C: 5.9 % (ref 4.0–6.0)

## 2014-11-04 LAB — TSH: TSH: 3.15 u[IU]/mL (ref 0.41–5.90)

## 2014-11-09 ENCOUNTER — Encounter: Payer: Self-pay | Admitting: Internal Medicine

## 2014-11-09 ENCOUNTER — Non-Acute Institutional Stay (SKILLED_NURSING_FACILITY): Payer: Medicare Other | Admitting: Internal Medicine

## 2014-11-09 DIAGNOSIS — N186 End stage renal disease: Secondary | ICD-10-CM | POA: Diagnosis not present

## 2014-11-09 DIAGNOSIS — N189 Chronic kidney disease, unspecified: Secondary | ICD-10-CM

## 2014-11-09 DIAGNOSIS — S32302S Unspecified fracture of left ilium, sequela: Secondary | ICD-10-CM | POA: Diagnosis not present

## 2014-11-09 DIAGNOSIS — IMO0001 Reserved for inherently not codable concepts without codable children: Secondary | ICD-10-CM

## 2014-11-09 DIAGNOSIS — D696 Thrombocytopenia, unspecified: Secondary | ICD-10-CM

## 2014-11-09 DIAGNOSIS — E43 Unspecified severe protein-calorie malnutrition: Secondary | ICD-10-CM | POA: Diagnosis not present

## 2014-11-09 DIAGNOSIS — E039 Hypothyroidism, unspecified: Secondary | ICD-10-CM | POA: Diagnosis not present

## 2014-11-09 DIAGNOSIS — S63512S Sprain of carpal joint of left wrist, sequela: Secondary | ICD-10-CM | POA: Diagnosis not present

## 2014-11-09 DIAGNOSIS — R600 Localized edema: Secondary | ICD-10-CM | POA: Diagnosis not present

## 2014-11-09 DIAGNOSIS — E119 Type 2 diabetes mellitus without complications: Secondary | ICD-10-CM

## 2014-11-09 DIAGNOSIS — D631 Anemia in chronic kidney disease: Secondary | ICD-10-CM

## 2014-11-09 DIAGNOSIS — I10 Essential (primary) hypertension: Secondary | ICD-10-CM | POA: Diagnosis not present

## 2014-11-09 DIAGNOSIS — Z794 Long term (current) use of insulin: Secondary | ICD-10-CM

## 2014-11-09 DIAGNOSIS — R2681 Unsteadiness on feet: Secondary | ICD-10-CM

## 2014-11-09 DIAGNOSIS — F329 Major depressive disorder, single episode, unspecified: Secondary | ICD-10-CM

## 2014-11-09 DIAGNOSIS — Z992 Dependence on renal dialysis: Secondary | ICD-10-CM

## 2014-11-09 DIAGNOSIS — H409 Unspecified glaucoma: Secondary | ICD-10-CM

## 2014-11-09 DIAGNOSIS — F32A Depression, unspecified: Secondary | ICD-10-CM

## 2014-11-09 NOTE — Progress Notes (Signed)
Patient ID: Stacy Ware, female   DOB: 1934-10-28, 80 y.o.   MRN: [id number]     [facility]  PCP: Birdena Jubilee, MD  Code Status: Full Code  No Known Allergies  Chief Complaint  Patient presents with  . New Admit To SNF    New Admission      HPI:  80 y.o. patient is here for short term rehabilitation post hospital admission from 10/27/14-11/02/14 post fall with fracture of left iliac wing and left wrist sprain. She was medically managed and sent to SNF. She has PMH of HTN, ESRD on dialaysis, OA, DM among others. She is seen in her room today. Her pain is under control with current regimen. She has noticed increased swelling to her legs, left > right. She is tolerating dialysis well. No new concerns from nursing staff.    Review of Systems:  Constitutional: Negative for fever, chills, diaphoresis.  HENT: Negative for headache, congestion, nasal discharge  Eyes: Negative for eye pain, blurred vision, double vision and discharge.  Respiratory: Negative for cough, shortness of breath and wheezing.   Cardiovascular: Negative for chest pain, palpitations  Gastrointestinal: Negative for heartburn, nausea, vomiting, abdominal pain. Bowel movement is regular Genitourinary: Negative for dysuria and flank pain.  Musculoskeletal: Negative for back pain, falls Skin: Negative for itching, rash.  Neurological: Negative for dizziness, tingling, focal weakness Psychiatric/Behavioral: Negative for depression   Past Medical History  Diagnosis Date  . History of alcohol abuse   . Diabetes mellitus without complication (HCC)     Secondary to severe pancreatic disease/failure as a result of alcohol abuse. She is followed by Dr. Izell Cidra; She sees Dr. Hazle Quant 2-3 x per year.     . Hyperlipidemia   . Hypertension   . Thyroid disease   . Glaucoma   . DDD (degenerative disc disease)   . Osteoporosis   . CKD (chronic kidney disease)      (Stage III);  Creatinine  1.3/Creatinine Clearance 43 on 10/05/2004.  She had a renal U/S in 10/2003 which showed a normal right kidney measuring 10.4 cm and an echogenic left kidney measuring 8.7 cm. She is followed by Dr. Louis Meckel.    . Anemia     Followed by Dr Allyne Gee  . Chronic pancreatitis (HCC)   . Osteoarthritis   . Bell's palsy   . Hemorrhoids   . Colon polyp    Past Surgical History  Procedure Laterality Date  . Eye surgery      Cataract   Social History:   reports that she quit smoking about 32 years ago. She has never used smokeless tobacco. She reports that she does not drink alcohol or use illicit drugs.  Family History  Problem Relation Age of Onset  . AAA (abdominal aortic aneurysm) Father   . Heart disease Brother   . Heart disease Paternal Aunt   . Cancer Maternal Grandmother     Medications:   Medication List       This list is accurate as of: 11/09/14 10:56 AM.  Always use your most recent med list.               DULoxetine 20 MG capsule  Commonly known as:  CYMBALTA  Take 40 mg by mouth daily.     HYDROcodone-acetaminophen 5-325 MG tablet  Commonly known as:  NORCO/VICODIN  Take 1 tablet by mouth every 6 (six) hours as needed for moderate pain.     insulin aspart 100  UNIT/ML injection  Commonly known as:  novoLOG  Inject 2-6 Units into the skin at bedtime. SSI SQ Q HS;  151-200=2 units; 201-250=3 units; 251-300=4units; 301-350=5 units; 351 and up 6 units     insulin aspart 100 UNIT/ML injection  Commonly known as:  novoLOG  Inject 2-12 Units into the skin 3 (three) times daily with meals. SSI SQ TID; 141-180 = 2 units; 181-220=4 units; 221-260=6units; 261-300=8units; 301-340=10 units; >340 = 12 units     latanoprost 0.005 % ophthalmic solution  Commonly known as:  XALATAN  Place 1 drop into both eyes at bedtime.     levothyroxine 75 MCG tablet  Commonly known as:  SYNTHROID, LEVOTHROID  Take 75 mcg by mouth daily before breakfast.     nebivolol 10 MG tablet    Commonly known as:  BYSTOLIC  Take 10 mg by mouth daily.     PROCEL 100 Powd  Take 1 scoop by mouth 2 (two) times daily. For nutritional support and to increase protein     promethazine 12.5 MG tablet  Commonly known as:  PHENERGAN  Take 12.5 mg by mouth every 6 (six) hours as needed for nausea or vomiting (Take 1/2/-2 tabs po every 4-6 hours as needed for nausea.).     traMADol-acetaminophen 37.5-325 MG tablet  Commonly known as:  ULTRACET  Take 1 tablet by mouth every 8 (eight) hours as needed.         Physical Exam: Filed Vitals:   11/09/14 1040  BP: 108/76  Pulse: 68  Temp: 97.9 F (36.6 C)  TempSrc: Oral  Resp: 19  Height:  (1.727 m)  Weight: 106 lb 12.8 oz (48.444 kg)  SpO2: 95%    General- elderly female, well built, in no acute distress Head- normocephalic, atraumatic Nose- normal nasal mucosa, no maxillary or frontal sinus tenderness, no nasal discharge Throat- moist mucus membrane Eyes- PERRLA, EOMI, no pallor, no icterus, no discharge, normal conjunctiva, normal sclera Neck- no cervical lymphadenopathy Cardiovascular- normal s1,s2, no murmurs, palpable dorsalis pedis and radial pulses, 1+ left and trace right leg edema Respiratory- bilateral clear to auscultation, no wheeze, no rhonchi, no crackles, no use of accessory muscles Abdomen- bowel sounds present, soft, non tender Musculoskeletal- able to move all 4 extremities, limited ROM left leg, left wrist in brace, able to move her fingers and has good capillary refill  Neurological- no focal deficit, alert and oriented to person, place and time Skin- warm and dry Psychiatry- normal mood and affect    Labs reviewed: Basic Metabolic Panel:  Recent Labs  16/10/96 11/04/14  NA 131* 138  K 4.2 3.6  BUN 62* 46*  CREATININE 3.4* 3.0*   Liver Function Tests:  Recent Labs  11/02/14  AST 16  ALT 14  ALKPHOS 59   No results for input(s): LIPASE, AMYLASE in the last 8760 hours. No results for  input(s): AMMONIA in the last 8760 hours. CBC:  Recent Labs  11/02/14 11/04/14  WBC 6.3 4.8  HGB 9.1* 9.0*  HCT 27* 29*  PLT 129* 134*   Lab Results  Component Value Date   TSH 3.15 11/04/2014   Lab Results  Component Value Date   HGBA1C 5.9 11/04/2014     Radiological Exams: Dg Thoracic Spine W/swimmers  09/09/2012  *RADIOLOGY REPORT* Clinical Data: Left side back pain. THORACIC SPINE - 2 VIEW + SWIMMERS Comparison: PA and lateral chest 11/14/2011. Findings: Vertebral body height and alignment are normal.  Anterior endplate spurring and loss of  disc space height in the mid thoracic spine appear unchanged.  Paraspinous structures are unremarkable. IMPRESSION: No acute finding.  No change in mid thoracic degenerative disease. Original Report Authenticated By: Holley Dexterhomas D'Alessio, M.D.    Assessment/Plan  Unsteady gait With recent fall and left iliac wing fracture. Will have her work with physical therapy and occupational therapy team to help with gait training and muscle strengthening exercises.fall precautions. Skin care. Encourage to be out of bed.   Fracture of left iliac wing continue Norco 5/325 mg q6h prn pain. D/c ultracet for now.   Anemia of chronic kidney disease  Monitor h&h periodically, at 9 at present  Left wrist sprain Continue to wear brace and prn pain medication  ESRD Continue HD 3 days a week  HTN Stable bp reading, continue bystolic 10 mg daily and monitor bp readings  Thrombocytopenia  No signs of bleed. Monitor platelet count  Protein calorie malnutrition Monitor weight and continue to encourage po intake. Appetite picking up per patient. Continue procel supplement and magic cup  Leg edema Appears to be dependent, add ted hose for now  DM Continue SSI with meals and monitor cbg. a1c 5.9 on review  Hypothyroidism Continue levothyroxine 75 mcg daily and monitor, reviewed tsh  Depression continue Cymbalta 40 mg daily and monitor her  mood  Glaucoma Continue latanoprost for now  Goals of care: short term rehabilitation   Labs/tests ordered: none at present  Family/ staff Communication: reviewed care plan with patient and nursing supervisor    Oneal GroutMAHIMA Hajar Penninger, MD  [facility] [phone] (Monday-Friday 8 am - 5 pm) [phone] (afterhours)

## 2014-11-25 ENCOUNTER — Encounter: Payer: Self-pay | Admitting: Adult Health

## 2014-11-25 ENCOUNTER — Non-Acute Institutional Stay (SKILLED_NURSING_FACILITY): Payer: Medicare Other | Admitting: Adult Health

## 2014-11-25 DIAGNOSIS — D696 Thrombocytopenia, unspecified: Secondary | ICD-10-CM

## 2014-11-25 DIAGNOSIS — E039 Hypothyroidism, unspecified: Secondary | ICD-10-CM | POA: Diagnosis not present

## 2014-11-25 DIAGNOSIS — N189 Chronic kidney disease, unspecified: Secondary | ICD-10-CM

## 2014-11-25 DIAGNOSIS — I10 Essential (primary) hypertension: Secondary | ICD-10-CM

## 2014-11-25 DIAGNOSIS — D631 Anemia in chronic kidney disease: Secondary | ICD-10-CM | POA: Diagnosis not present

## 2014-11-25 DIAGNOSIS — N186 End stage renal disease: Secondary | ICD-10-CM

## 2014-11-25 DIAGNOSIS — E119 Type 2 diabetes mellitus without complications: Secondary | ICD-10-CM | POA: Diagnosis not present

## 2014-11-25 DIAGNOSIS — F329 Major depressive disorder, single episode, unspecified: Secondary | ICD-10-CM | POA: Diagnosis not present

## 2014-11-25 DIAGNOSIS — Z794 Long term (current) use of insulin: Secondary | ICD-10-CM

## 2014-11-25 DIAGNOSIS — S6992XS Unspecified injury of left wrist, hand and finger(s), sequela: Secondary | ICD-10-CM

## 2014-11-25 DIAGNOSIS — S32302S Unspecified fracture of left ilium, sequela: Secondary | ICD-10-CM

## 2014-11-25 DIAGNOSIS — IMO0001 Reserved for inherently not codable concepts without codable children: Secondary | ICD-10-CM

## 2014-11-25 DIAGNOSIS — Z992 Dependence on renal dialysis: Secondary | ICD-10-CM

## 2014-11-25 DIAGNOSIS — E43 Unspecified severe protein-calorie malnutrition: Secondary | ICD-10-CM

## 2014-11-25 DIAGNOSIS — F32A Depression, unspecified: Secondary | ICD-10-CM

## 2014-11-25 NOTE — Progress Notes (Signed)
Patient ID: Stacy Ware, female   DOB: [birth year], 80 y.o.   MRN: [id number]    DATE:  11/25/2014   MRN:  [id number]  BIRTHDAY: October 13, 1934  Facility:  Nursing Home Location:  [facility]  Nursing Home Room Number: [id number]  LEVEL OF CARE:  SNF (31)  Contact Information    Name Relation Home Work Mobile   Mabe,Angela Daughter   [phone]   Mabe,Brian Other [phone]  [phone]      Chief Complaint  Patient presents with  . Discharge Note    Fracture of left iliac wing, left wrist pain, anemia, thrombocytopenia, ESRD, IDDM, hypertension, depression, hypothyroidism and protein calorie malnutrition    HISTORY OF PRESENT ILLNESS:  This is a 80 year old female who is for discharge home with home health PT, OT, SWS and CNA. DME:  Standard rolling walker. She has been admitted to [facility] on 11/02/14 from [facility]. She has history of hypertension, IDDM, osteoarthritis, ESRD, hyperlipidemia, anemia and thrombocytopenia. She had a fall at home and sustained fracture of her left iliac wing and left wrist pain.  Patient was admitted to this facility for short-term rehabilitation after the patient's recent hospitalization.  Patient has completed SNF rehabilitation and therapy has cleared the patient for discharge.  PAST MEDICAL HISTORY:  Past Medical History  Diagnosis Date  . History of alcohol abuse   . Diabetes mellitus without complication (HCC)     Secondary to severe pancreatic disease/failure as a result of alcohol abuse. She is followed by Dr. Izell Wilmore; She sees Dr. Hazle Quant 2-3 x per year.     . Hyperlipidemia   . Hypertension   . Thyroid disease   . Glaucoma   . DDD (degenerative disc disease)   . Osteoporosis   . CKD (chronic kidney disease)      (Stage III);  Creatinine 1.3/Creatinine Clearance 43 on 10/05/2004.  She had a renal U/S in 10/2003 which showed a normal right kidney measuring 10.4 cm and an echogenic left  kidney measuring 8.7 cm. She is followed by Dr. Louis Meckel.    . Anemia     Followed by Dr Allyne Gee  . Chronic pancreatitis (HCC)   . Osteoarthritis   . Bell's palsy   . Hemorrhoids   . Colon polyp      CURRENT MEDICATIONS: Reviewed  Patient's Medications  New Prescriptions   No medications on file  Previous Medications   DULOXETINE (CYMBALTA) 20 MG CAPSULE    Take 40 mg by mouth daily.   HYDROCODONE-ACETAMINOPHEN (NORCO/VICODIN) 5-325 MG TABLET    Take 1 tablet by mouth every 6 (six) hours as needed for moderate pain.   INSULIN ASPART (NOVOLOG) 100 UNIT/ML INJECTION    Inject 2-12 Units into the skin 3 (three) times daily with meals. SSI SQ TID; 141-180 = 2 units; 181-220=4 units; 221-260=6units; 261-300=8units; 301-340=10 units; >340 = 12 units   LATANOPROST (XALATAN) 0.005 % OPHTHALMIC SOLUTION    Place 1 drop into both eyes at bedtime.   LEVOTHYROXINE (SYNTHROID, LEVOTHROID) 75 MCG TABLET    Take 75 mcg by mouth daily before breakfast.   NEBIVOLOL (BYSTOLIC) 10 MG TABLET    Take 10 mg by mouth daily.   PROMETHAZINE (PHENERGAN) 12.5 MG TABLET    Take 12.5 mg by mouth every 6 (six) hours as needed for nausea or vomiting (Take 1/2/-2 tabs po every 4-6 hours as needed for nausea.).   PROTEIN (PROCEL 100) POWD    Take 1 scoop  by mouth 2 (two) times daily. For nutritional support and to increase protein   TRAMADOL-ACETAMINOPHEN (ULTRACET) 37.5-325 MG TABLET    Take 1 tablet by mouth every 8 (eight) hours as needed.  Modified Medications   No medications on file  Discontinued Medications   INSULIN ASPART (NOVOLOG) 100 UNIT/ML INJECTION    Inject 2-6 Units into the skin at bedtime. SSI SQ Q HS;  151-200=2 units; 201-250=3 units; 251-300=4units; 301-350=5 units; 351 and up 6 units     No Known Allergies   REVIEW OF SYSTEMS:  GENERAL: no change in appetite, no fatigue, no weight changes, no fever, chills or weakness EYES: Denies change in vision, dry eyes, eye pain, itching or  discharge EARS: Denies change in hearing, ringing in ears, or earache NOSE: Denies nasal congestion or epistaxis MOUTH and THROAT: Denies oral discomfort, gingival pain or bleeding, pain from teeth or hoarseness   RESPIRATORY: no cough, SOB, DOE, wheezing, hemoptysis CARDIAC: no chest pain, edema or palpitations GI: no abdominal pain, diarrhea, constipation, heart burn, nausea or vomiting GU: Denies dysuria, frequency, hematuria, incontinence, or discharge PSYCHIATRIC: Denies feeling of depression or anxiety. No report of hallucinations, insomnia, paranoia, or agitation   PHYSICAL EXAMINATION  GENERAL APPEARANCE: Well nourished. In no acute distress. Normal body habitus HEAD: Normal in size and contour. No evidence of trauma EYES: Lids open and close normally. No blepharitis, entropion or ectropion. PERRL. Conjunctivae are clear and sclerae are white. Lenses are without opacity EARS: Pinnae are normal. Patient hears normal voice tunes of the examiner MOUTH and THROAT: Lips are without lesions. Oral mucosa is moist and without lesions. Tongue is normal in shape, size, and color and without lesions NECK: supple, trachea midline, no neck masses, no thyroid tenderness, no thyromegaly LYMPHATICS: no LAN in the neck, no supraclavicular LAN RESPIRATORY: breathing is even & unlabored, BS CTAB CARDIAC: RRR, no murmur,no extra heart sounds, no edema; right chest PermCath GI: abdomen soft, normal BS, no masses, no tenderness, no hepatomegaly, no splenomegaly EXTREMITIES:  Able to move 4 extremities; left wrist has splint PSYCHIATRIC: Alert and oriented X 3. Affect and behavior are appropriate  LABS/RADIOLOGY: Labs reviewed: 11/04/14   TSH 3.149 T4 total 7.96  Free T 1.87 T3 res uptake 46.9  hemoglobin A1c 5.9 Basic Metabolic Panel:  Recent Labs  40/98/1108/11/07 11/04/14  NA 131* 138  K 4.2 3.6  BUN 62* 46*  CREATININE 3.4* 3.0*   Liver Function Tests:  Recent Labs  11/02/14  AST 16  ALT 14   ALKPHOS 59   CBC:  Recent Labs  11/02/14 11/04/14  WBC 6.3 4.8  HGB 9.1* 9.0*  HCT 27* 29*  PLT 129* 134*     ASSESSMENT/PLAN:  Fracture of left iliac wing - for home health PT OT SW and CNA; continue Norco 5/325 mg 1 tab by mouth every 6 hours when necessary and Ultracet 37.5-325 mg 1 tab by mouth every 8 hours when necessary for pain  Anemia of chronic kidney disease - hemoglobin 9.0; stable   Thrombocytopenia - platelet 134; improved from 129  ESRD - continue hemodialysis on M- W-F  IDDM - continue NovoLog SS I subcutaneous before meals and at bedtime; hemoglobin A1c 5.9  Hypertension - continue Bystolic 10 mg 1 tab by mouth daily  Depression - mood is stable; continue Cymbalta 40 mg by mouth daily  Hypothyroidism - continue Synthroid 75 g by mouth daily; check TSH  Protein calorie malnutrition, severe - albumin 2.8; continue supplementation  Left wrist pain - continue supportive management; splinting and Ultracet PRN      I have filled out patient's discharge paperwork and written prescriptions.  Patient will receive home health PT, OT, SW and CNA.  DME provided:  Standard rolling walker  Total discharge time: Greater than 30 minutes  Discharge time involved coordination of the discharge process with social worker, nursing staff and therapy department. Medical justification for home health services/DME verified.     [facility], NP [employer] [phone]

## 2014-12-28 IMAGING — CR DG THORACIC SPINE 3V
3 series · 3 of 3 positions shown · non-contrast
Comparison: PA and lateral chest 11/14/2011.

CLINICAL DATA: Left side back pain.

THORACIC SPINE - 2 VIEW + SWIMMERS

[w t-spine a.p. *]
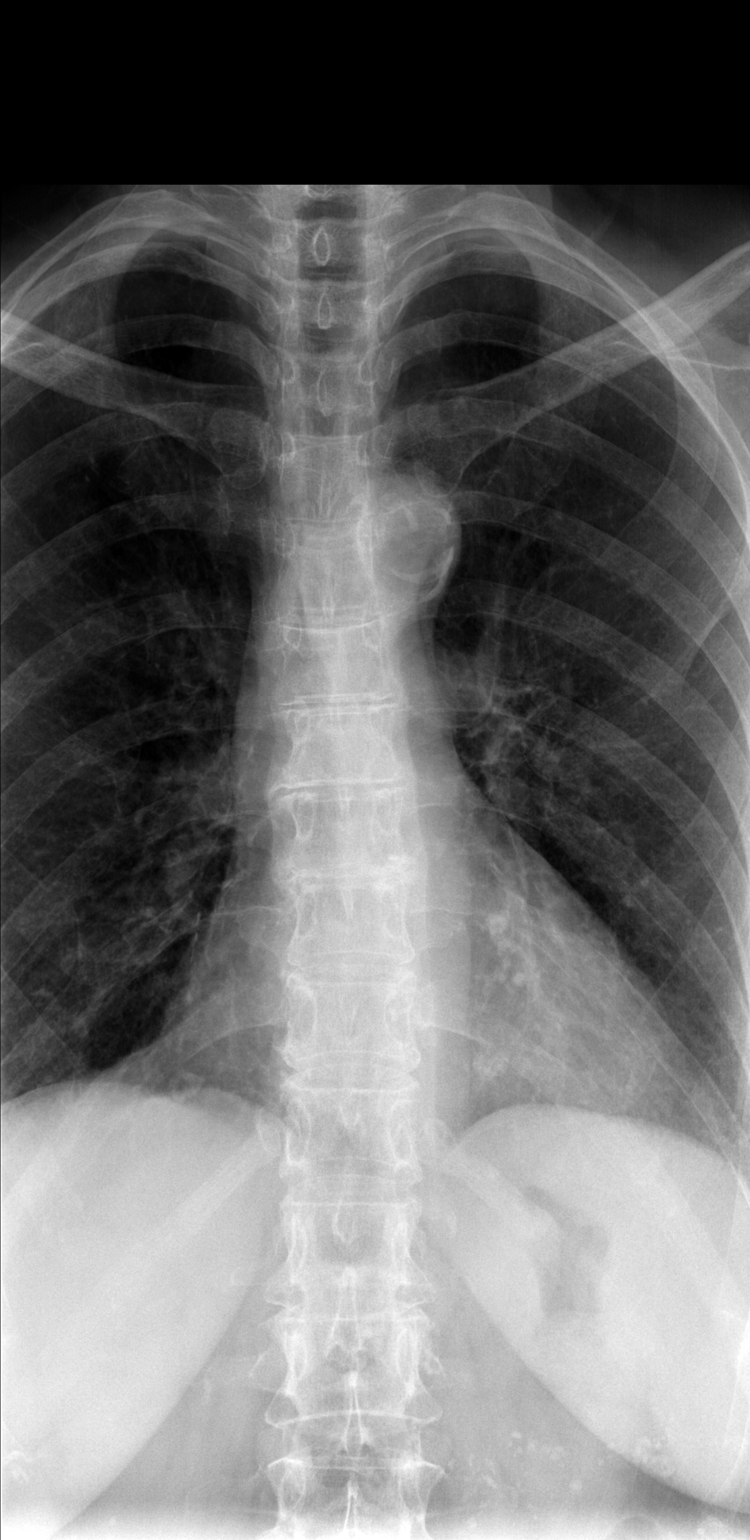

[w t-spine lat *]
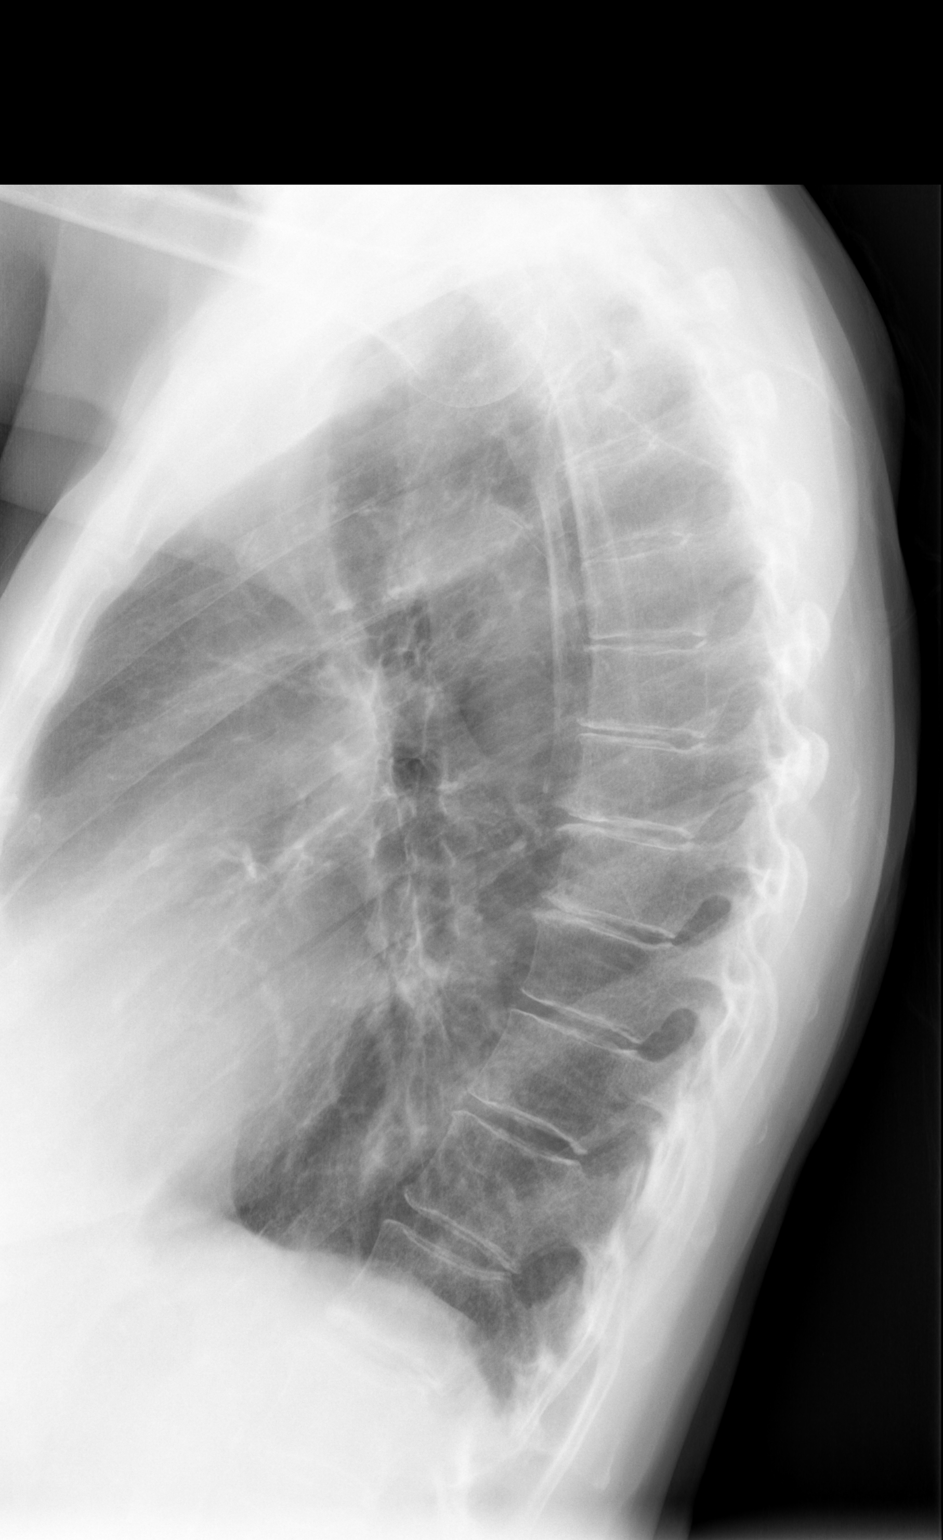

[w swimmers view]
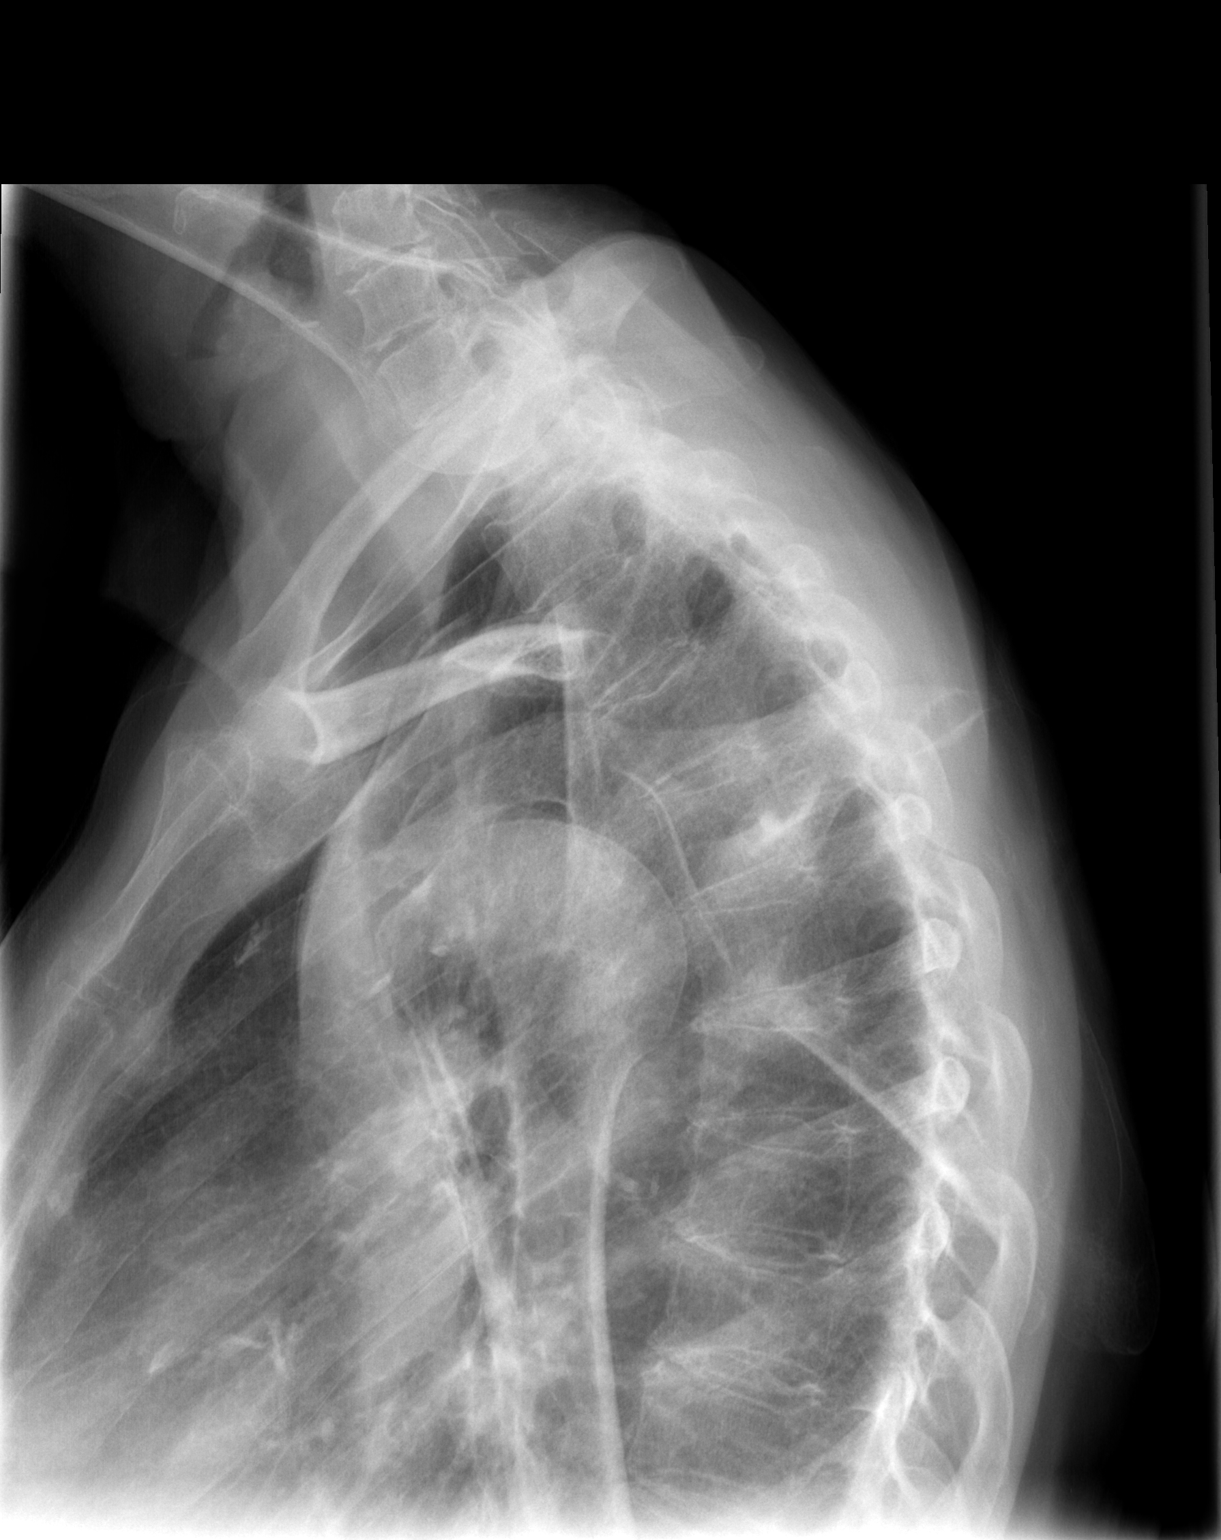

[3 of 3 positions shown; findings below may reference images not displayed]

FINDINGS: Vertebral body height and alignment are normal.  Anterior
endplate spurring and loss of disc space height in the mid thoracic
spine appear unchanged.  Paraspinous structures are unremarkable.
IMPRESSION: No acute finding.  No change in mid thoracic degenerative disease.

## 2015-05-24 DEATH — deceased
